# Patient Record
Sex: Male | Born: 1975 | Race: White | Hispanic: No | Marital: Single
Health system: Southern US, Community
[De-identification: ages and names within clinical notes are randomized; demographics above are authoritative.]

---

## 2006-11-06 ENCOUNTER — Emergency Department: Payer: Self-pay | Admitting: Emergency Medicine

## 2007-05-05 ENCOUNTER — Emergency Department (HOSPITAL_COMMUNITY): Admission: EM | Admit: 2007-05-05 | Discharge: 2007-05-05 | Payer: Self-pay | Admitting: Emergency Medicine

## 2011-05-11 LAB — I-STAT 8, (EC8 V) (CONVERTED LAB)
Acid-Base Excess: 2
Bicarbonate: 24
Potassium: 4.1
Sodium: 137
TCO2: 25
pH, Ven: 7.502 — ABNORMAL HIGH

## 2011-05-11 LAB — CBC
MCHC: 34.2
Platelets: 247
WBC: 10.8 — ABNORMAL HIGH

## 2011-05-11 LAB — DIFFERENTIAL
Basophils Absolute: 0
Basophils Relative: 0
Eosinophils Relative: 1
Lymphocytes Relative: 14
Lymphs Abs: 1.5
Monocytes Relative: 8
Neutrophils Relative %: 76

## 2011-05-11 LAB — POCT CARDIAC MARKERS
Operator id: 272551
Troponin i, poc: <0.05

## 2011-05-11 LAB — POCT I-STAT CREATININE: Operator id: 272551

## 2020-03-04 ENCOUNTER — Ambulatory Visit
Admission: RE | Admit: 2020-03-04 | Discharge: 2020-03-04 | Disposition: A | Discharge status: Home / Self Care | Payer: Self-pay | Source: Ambulatory Visit | Attending: Urology | Admitting: Urology

## 2020-03-04 ENCOUNTER — Other Ambulatory Visit: Payer: Self-pay

## 2020-03-04 ENCOUNTER — Other Ambulatory Visit: Payer: Self-pay | Admitting: Urology

## 2020-03-04 DIAGNOSIS — N5089 Other specified disorders of the male genital organs: Secondary | ICD-10-CM | POA: Insufficient documentation

## 2020-03-22 ENCOUNTER — Ambulatory Visit: Payer: Medicaid Other | Attending: Internal Medicine

## 2020-03-22 DIAGNOSIS — Z23 Encounter for immunization: Secondary | ICD-10-CM

## 2020-03-22 NOTE — Progress Notes (Signed)
   Covid-19 Vaccination Clinic  Name:  Henry Monroe    MRN: [id number] DOB: Aug 21, 1975  03/22/2020  Mr. Soward was observed post Covid-19 immunization for 15 minutes without incident. He was provided with Vaccine Information Sheet and instruction to access the V-Safe system.   Mr. Ridener was instructed to call 911 with any severe reactions post vaccine: Marland Kitchen Difficulty breathing  . Swelling of face and throat  . A fast heartbeat  . A bad rash all over body  . Dizziness and weakness   Immunizations Administered    Name Date Dose VIS Date Route   Pfizer COVID-19 Vaccine 03/22/2020 11:19 AM 0.3 mL 09/24/2018 Intramuscular   Manufacturer: [employer], [employer]   Lot: [id number]   NDC: [address]-0

## 2020-04-12 ENCOUNTER — Ambulatory Visit: Payer: Medicaid Other | Attending: Critical Care Medicine

## 2020-04-12 ENCOUNTER — Ambulatory Visit: Payer: Medicaid Other

## 2020-04-12 DIAGNOSIS — Z23 Encounter for immunization: Secondary | ICD-10-CM

## 2020-04-12 NOTE — Progress Notes (Signed)
   Covid-19 Vaccination Clinic  Name:  Henry Monroe    MRN: [id number] DOB: 06-10-1976  04/12/2020  [name] was observed post Covid-19 immunization for 15 minutes without incident. He was provided with Vaccine Information Sheet and instruction to access the V-Safe system.   [name] was instructed to call 911 with any severe reactions post vaccine: Marland Kitchen Difficulty breathing  . Swelling of face and throat  . A fast heartbeat  . A bad rash all over body  . Dizziness and weakness   Immunizations Administered    Name Date Dose VIS Date Route   Pfizer COVID-19 Vaccine 04/12/2020 10:49 AM 0.3 mL 09/24/2018 Intramuscular   Manufacturer: [employer], [employer]   Lot: [id number]   NDC: [address]-9

## 2021-05-19 ENCOUNTER — Other Ambulatory Visit: Payer: Self-pay | Admitting: Physical Medicine and Rehabilitation

## 2021-05-19 DIAGNOSIS — M545 Low back pain, unspecified: Secondary | ICD-10-CM

## 2021-05-19 DIAGNOSIS — Z9889 Other specified postprocedural states: Secondary | ICD-10-CM

## 2021-05-30 ENCOUNTER — Ambulatory Visit
Admission: RE | Admit: 2021-05-30 | Discharge: 2021-05-30 | Disposition: A | Discharge status: Home / Self Care | Payer: Disability Insurance | Attending: Physical Medicine and Rehabilitation | Admitting: Physical Medicine and Rehabilitation

## 2021-05-30 ENCOUNTER — Ambulatory Visit
Admission: RE | Admit: 2021-05-30 | Discharge: 2021-05-30 | Disposition: A | Discharge status: Home / Self Care | Payer: Disability Insurance | Source: Ambulatory Visit | Attending: Physical Medicine and Rehabilitation | Admitting: Physical Medicine and Rehabilitation

## 2021-05-30 DIAGNOSIS — M545 Low back pain, unspecified: Secondary | ICD-10-CM | POA: Insufficient documentation

## 2021-05-30 DIAGNOSIS — Z9889 Other specified postprocedural states: Secondary | ICD-10-CM

## 2022-08-10 IMAGING — CR DG ANKLE 2V *L*
2 series · 2 of 2 positions shown · non-contrast
Comparison: None.

CLINICAL DATA: History of ankle surgery.

EXAM:
LEFT ANKLE - 2 VIEW

[ankle ap]
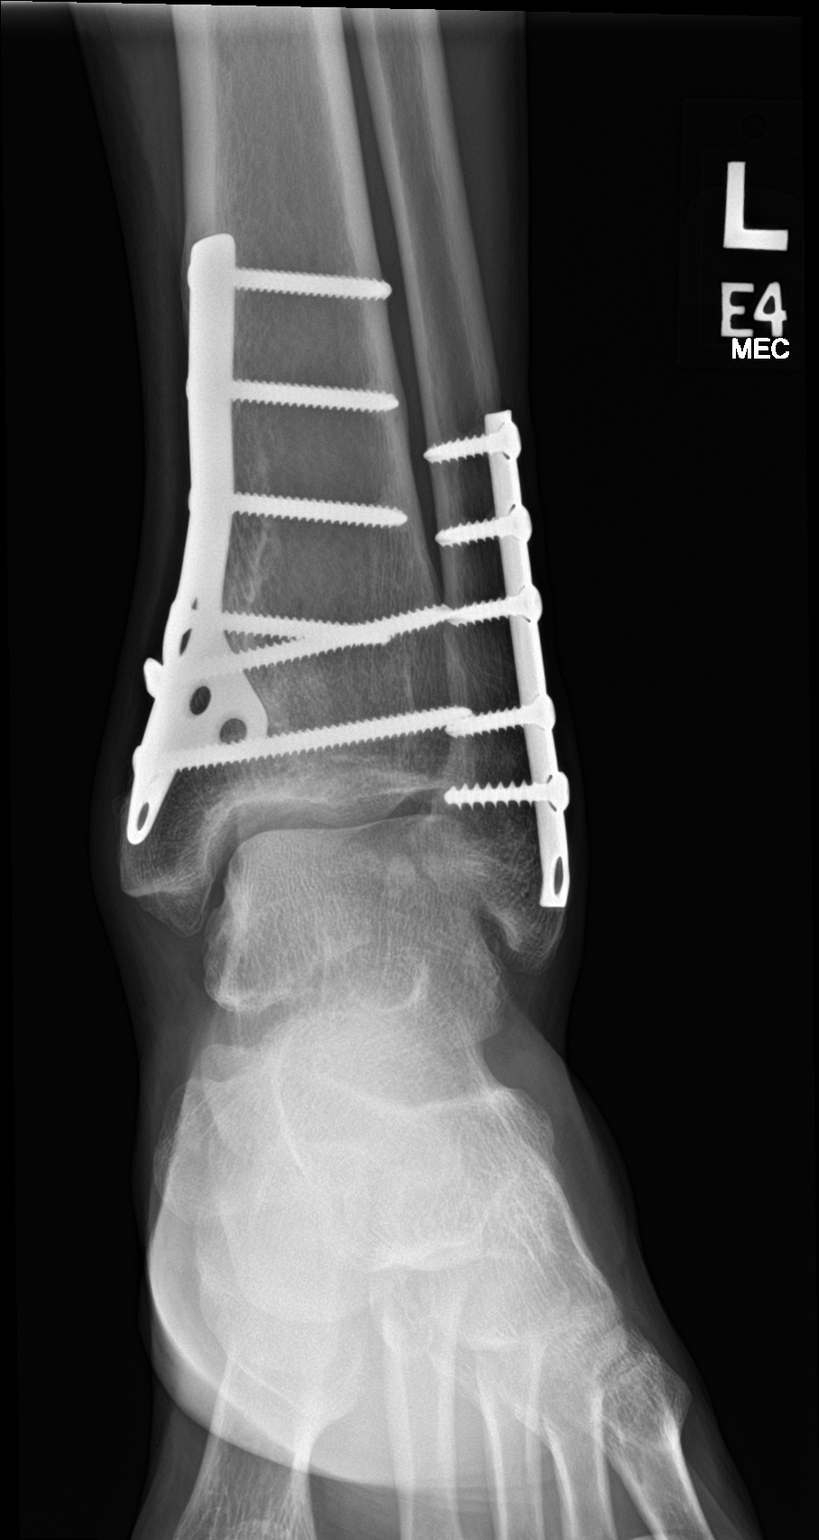

[ankle lat]
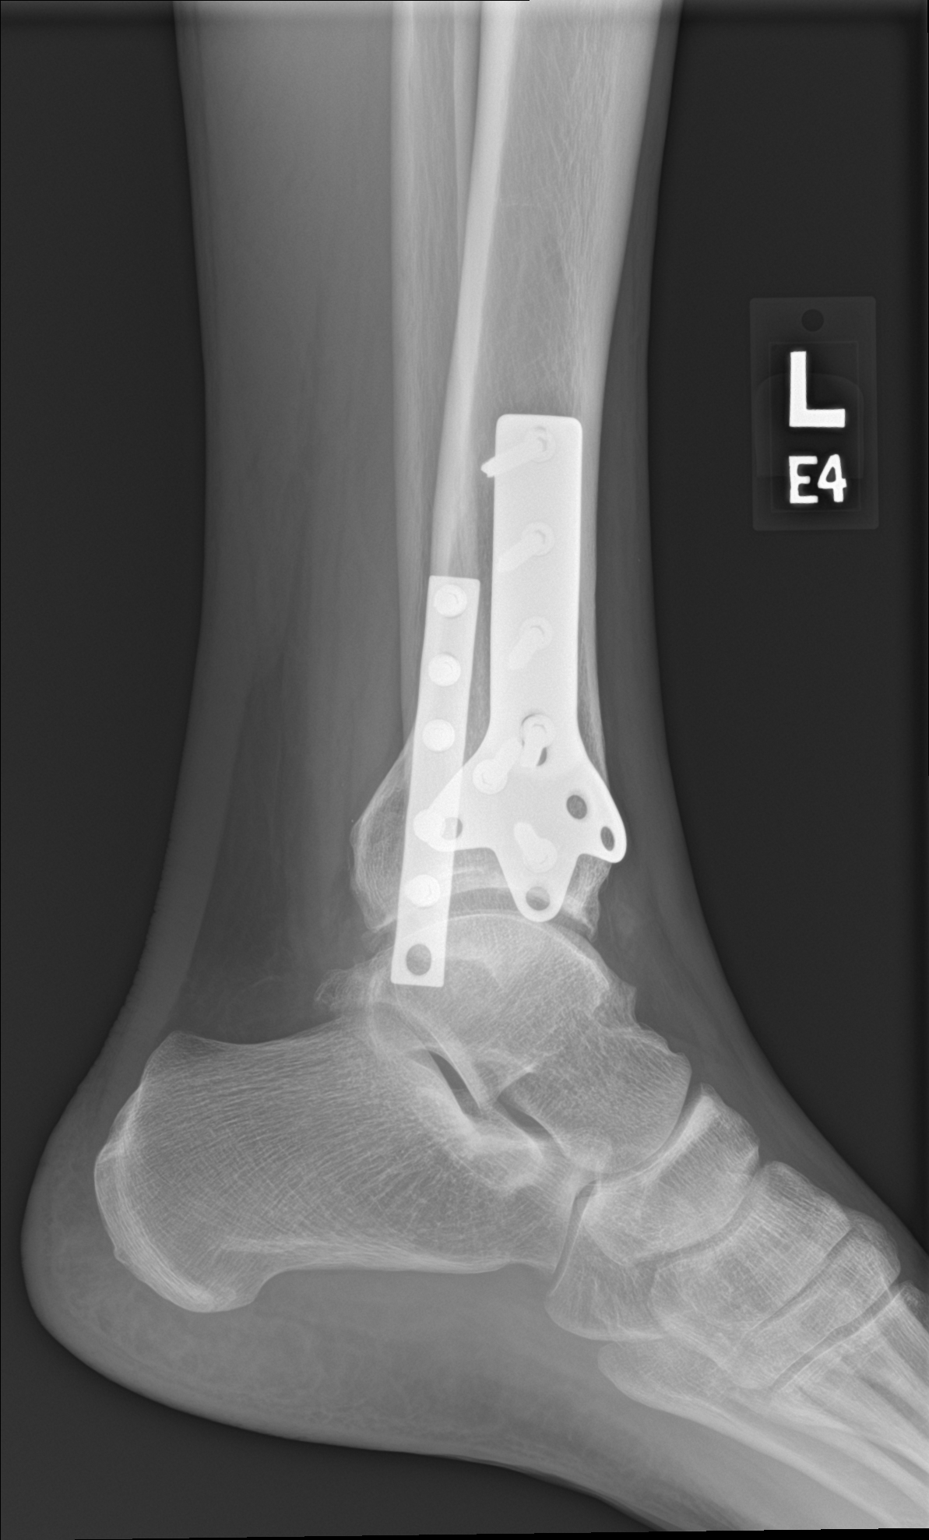

[2 of 2 positions shown; findings below may reference images not displayed]

FINDINGS: Fixation sideplate and screws of the distal tibia and fibula. The
hardware is intact. There is no acute fracture or dislocation. The
ankle mortise is intact. The soft tissues are unremarkable.
IMPRESSION: No acute fracture or dislocation.

## 2022-08-10 IMAGING — CR DG ANKLE 2V *R*
2 series · 2 of 2 positions shown · non-contrast
Comparison: None.

CLINICAL DATA: History of ankle surgery.

EXAM:
RIGHT ANKLE - 2 VIEW

[ankle ap]
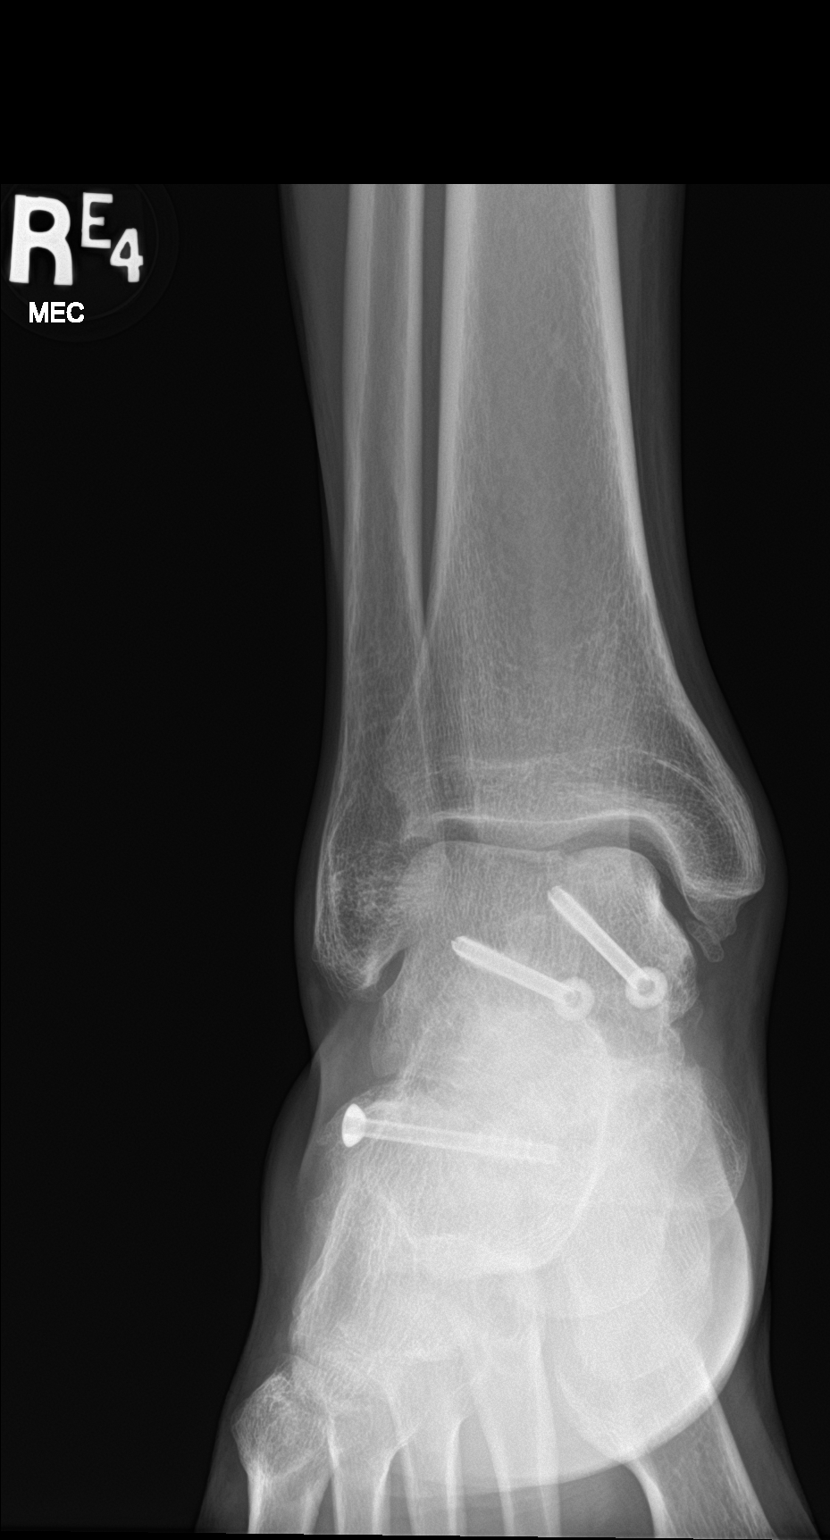

[ankle lat]
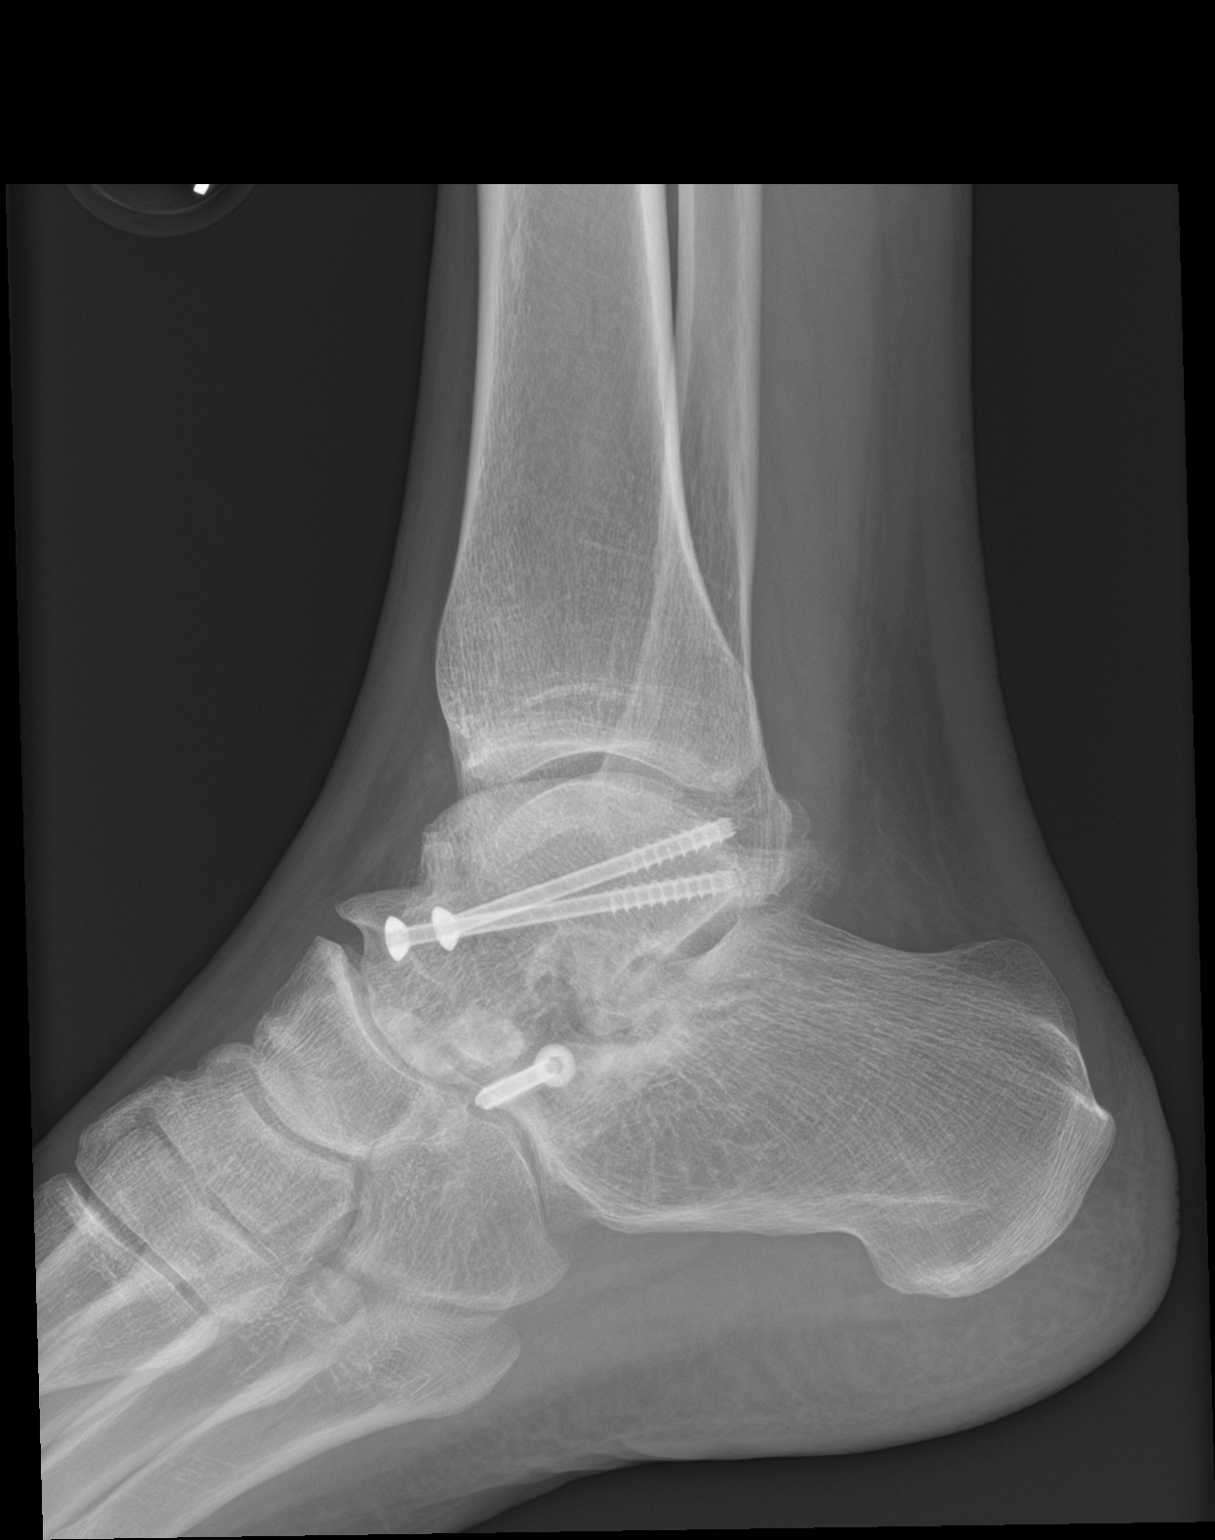

[2 of 2 positions shown; findings below may reference images not displayed]

FINDINGS: Fixation screws of the talus and calcaneus appear intact. Small
focus of cortical defect involving the medial talar dome may
represent an osteochondral lesion versus less likely a small
cortical fracture. There is no dislocation. The soft tissues are
unremarkable.
IMPRESSION: 1. Hardware intact.
2. Possible osteochondral lesion of the medial talar dome.
